# Patient Record
Sex: Male | Born: 1990 | Hispanic: Yes | Marital: Single | Smoking: Current every day smoker
Health system: Southern US, Community
[De-identification: ages and names within clinical notes are randomized; demographics above are authoritative.]

---

## 2019-01-26 ENCOUNTER — Emergency Department (HOSPITAL_COMMUNITY): Payer: Self-pay

## 2019-01-26 ENCOUNTER — Other Ambulatory Visit: Payer: Self-pay

## 2019-01-26 ENCOUNTER — Encounter (HOSPITAL_COMMUNITY): Payer: Self-pay

## 2019-01-26 ENCOUNTER — Emergency Department (HOSPITAL_COMMUNITY)
Admission: EM | Admit: 2019-01-26 | Discharge: 2019-01-26 | Disposition: A | Discharge status: Home / Self Care | Payer: Self-pay | Attending: Emergency Medicine | Admitting: Emergency Medicine

## 2019-01-26 DIAGNOSIS — R1011 Right upper quadrant pain: Secondary | ICD-10-CM | POA: Insufficient documentation

## 2019-01-26 DIAGNOSIS — R252 Cramp and spasm: Secondary | ICD-10-CM | POA: Insufficient documentation

## 2019-01-26 DIAGNOSIS — F17200 Nicotine dependence, unspecified, uncomplicated: Secondary | ICD-10-CM | POA: Insufficient documentation

## 2019-01-26 DIAGNOSIS — M549 Dorsalgia, unspecified: Secondary | ICD-10-CM | POA: Insufficient documentation

## 2019-01-26 LAB — CBC WITH DIFFERENTIAL/PLATELET
Abs Immature Granulocytes: 0.04 10*3/uL (ref 0.00–0.07)
Basophils Absolute: 0 10*3/uL (ref 0.0–0.1)
Basophils Relative: 0 %
Eosinophils Absolute: 0.1 10*3/uL (ref 0.0–0.5)
Eosinophils Relative: 1 %
HCT: 54.9 % — ABNORMAL HIGH (ref 39.0–52.0)
Hemoglobin: 18.4 g/dL — ABNORMAL HIGH (ref 13.0–17.0)
Immature Granulocytes: 0 %
Lymphocytes Relative: 12 %
Lymphs Abs: 1.4 10*3/uL (ref 0.7–4.0)
MCH: 34.5 pg — ABNORMAL HIGH (ref 26.0–34.0)
MCHC: 33.5 g/dL (ref 30.0–36.0)
MCV: 102.8 fL — ABNORMAL HIGH (ref 80.0–100.0)
Monocytes Absolute: 0.8 10*3/uL (ref 0.1–1.0)
Monocytes Relative: 7 %
Neutro Abs: 9.9 10*3/uL — ABNORMAL HIGH (ref 1.7–7.7)
Neutrophils Relative %: 80 %
Platelets: 187 10*3/uL (ref 150–400)
RBC: 5.34 MIL/uL (ref 4.22–5.81)
RDW: 12.7 % (ref 11.5–15.5)
WBC: 12.3 10*3/uL — ABNORMAL HIGH (ref 4.0–10.5)
nRBC: 0 % (ref 0.0–0.2)

## 2019-01-26 LAB — BASIC METABOLIC PANEL
Anion gap: 6 (ref 5–15)
BUN: 19 mg/dL (ref 6–20)
CO2: 25 mmol/L (ref 22–32)
Calcium: 9.2 mg/dL (ref 8.9–10.3)
Chloride: 105 mmol/L (ref 98–111)
Creatinine, Ser: 1.12 mg/dL (ref 0.61–1.24)
GFR calc Af Amer: >60 mL/min (ref >60)
GFR calc non Af Amer: >60 mL/min (ref >60)
Glucose, Bld: 104 mg/dL — ABNORMAL HIGH (ref 70–99)
Potassium: 3.7 mmol/L (ref 3.5–5.1)
Sodium: 136 mmol/L (ref 135–145)

## 2019-01-26 LAB — TYPE AND SCREEN
ABO/RH(D): A POS
Antibody Screen: NEGATIVE

## 2019-01-26 MED ORDER — SODIUM CHLORIDE (PF) 0.9 % IJ SOLN
INTRAMUSCULAR | Status: AC
  Filled 2019-01-26: qty 50

## 2019-01-26 MED ORDER — LORAZEPAM 2 MG/ML IJ SOLN
0.5000 mg | Freq: Once | INTRAMUSCULAR | Status: AC
  Administered 2019-01-26: 0.5 mg via INTRAVENOUS
  Filled 2019-01-26: qty 1

## 2019-01-26 MED ORDER — METHOCARBAMOL 750 MG PO TABS: 750.0000 mg | ORAL_TABLET | Freq: Four times a day (QID) | ORAL | 0 refills | Status: AC

## 2019-01-26 MED ORDER — NAPROXEN 500 MG PO TABS: 500.0000 mg | ORAL_TABLET | Freq: Two times a day (BID) | ORAL | 0 refills | Status: AC

## 2019-01-26 MED ORDER — IOHEXOL 300 MG/ML  SOLN
100.0000 mL | Freq: Once | INTRAMUSCULAR | Status: AC | PRN
  Administered 2019-01-26: 100 mL via INTRAVENOUS

## 2019-01-26 MED ORDER — MORPHINE SULFATE (PF) 4 MG/ML IV SOLN
6.0000 mg | Freq: Once | INTRAVENOUS | Status: AC
  Administered 2019-01-26: 17:00:00 6 mg via INTRAVENOUS
  Filled 2019-01-26: qty 2

## 2019-01-26 MED ORDER — SODIUM CHLORIDE 0.9 % IV SOLN: INTRAVENOUS | Status: DC
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
